# Patient Record
Sex: Male | Born: 1965 | Race: White | Hispanic: No | Marital: Married | Smoking: Never smoker
Health system: Southern US, Community
[De-identification: ages and names within clinical notes are randomized; demographics above are authoritative.]

## PROBLEM LIST (undated history)

## (undated) HISTORY — PX: TONSILLECTOMY: SUR1361

## (undated) HISTORY — PX: APPENDECTOMY: SHX54

---

## 2021-05-23 ENCOUNTER — Emergency Department (HOSPITAL_BASED_OUTPATIENT_CLINIC_OR_DEPARTMENT_OTHER): Payer: BLUE CROSS/BLUE SHIELD

## 2021-05-23 ENCOUNTER — Emergency Department (HOSPITAL_BASED_OUTPATIENT_CLINIC_OR_DEPARTMENT_OTHER)
Admission: EM | Admit: 2021-05-23 | Discharge: 2021-05-23 | Disposition: A | Discharge status: Home / Self Care | Payer: BLUE CROSS/BLUE SHIELD | Attending: Emergency Medicine | Admitting: Emergency Medicine

## 2021-05-23 ENCOUNTER — Encounter (HOSPITAL_BASED_OUTPATIENT_CLINIC_OR_DEPARTMENT_OTHER): Payer: Self-pay | Admitting: *Deleted

## 2021-05-23 ENCOUNTER — Other Ambulatory Visit: Payer: Self-pay

## 2021-05-23 DIAGNOSIS — R079 Chest pain, unspecified: Secondary | ICD-10-CM

## 2021-05-23 DIAGNOSIS — R0789 Other chest pain: Secondary | ICD-10-CM | POA: Insufficient documentation

## 2021-05-23 DIAGNOSIS — R000 Tachycardia, unspecified: Secondary | ICD-10-CM | POA: Diagnosis not present

## 2021-05-23 LAB — CBC
HCT: 52.9 % — ABNORMAL HIGH (ref 39.0–52.0)
Hemoglobin: 18.4 g/dL — ABNORMAL HIGH (ref 13.0–17.0)
MCH: 30.8 pg (ref 26.0–34.0)
MCHC: 34.8 g/dL (ref 30.0–36.0)
MCV: 88.6 fL (ref 80.0–100.0)
Platelets: 223 10*3/uL (ref 150–400)
RBC: 5.97 MIL/uL — ABNORMAL HIGH (ref 4.22–5.81)
RDW: 13.2 % (ref 11.5–15.5)
WBC: 14.1 10*3/uL — ABNORMAL HIGH (ref 4.0–10.5)
nRBC: 0 % (ref 0.0–0.2)

## 2021-05-23 LAB — BASIC METABOLIC PANEL
Anion gap: 10 (ref 5–15)
BUN: 11 mg/dL (ref 6–20)
CO2: 24 mmol/L (ref 22–32)
Calcium: 9.3 mg/dL (ref 8.9–10.3)
Chloride: 102 mmol/L (ref 98–111)
Creatinine, Ser: 1.24 mg/dL (ref 0.61–1.24)
GFR, Estimated: >60 mL/min (ref >60)
Glucose, Bld: 126 mg/dL — ABNORMAL HIGH (ref 70–99)
Potassium: 3.8 mmol/L (ref 3.5–5.1)
Sodium: 136 mmol/L (ref 135–145)

## 2021-05-23 LAB — TROPONIN I (HIGH SENSITIVITY)
Troponin I (High Sensitivity): 3 ng/L (ref <18)
Troponin I (High Sensitivity): 3 ng/L (ref <18)

## 2021-05-23 LAB — D-DIMER, QUANTITATIVE: D-Dimer, Quant: <0.27 ug/mL-FEU (ref 0.00–0.50)

## 2021-05-23 MED ORDER — ONDANSETRON HCL 4 MG/2ML IJ SOLN
4.0000 mg | Freq: Once | INTRAMUSCULAR | Status: DC
  Filled 2021-05-23: qty 2

## 2021-05-23 MED ORDER — MORPHINE SULFATE (PF) 4 MG/ML IV SOLN
4.0000 mg | Freq: Once | INTRAVENOUS | Status: DC
  Filled 2021-05-23: qty 1
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
## 2022-08-26 IMAGING — DX DG CHEST 1V PORT
1 series · 1 of 1 positions shown · non-contrast
Comparison: None.

CLINICAL DATA: Chest pain

EXAM:
PORTABLE CHEST 1 VIEW

[chest ap]
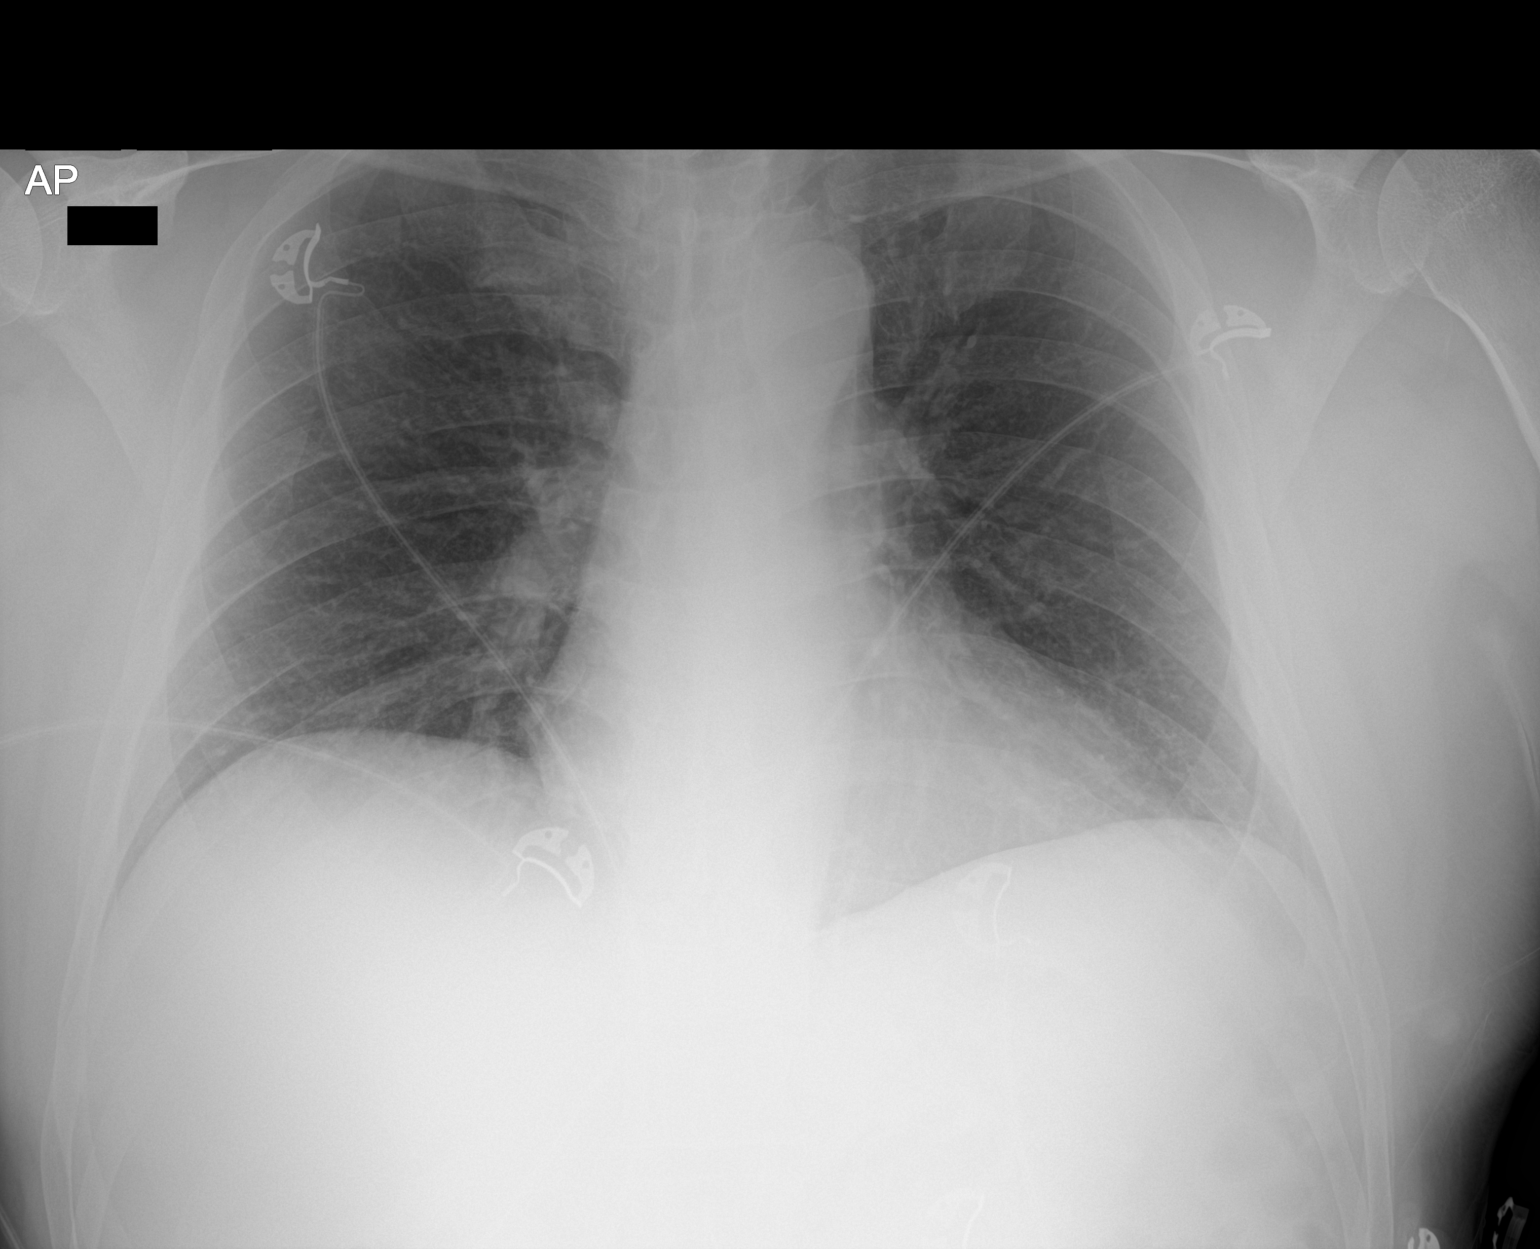

[1 of 1 positions shown; findings below may reference images not displayed]

FINDINGS: The heart size and mediastinal contours are within normal limits.
Both lungs are clear. The visualized skeletal structures are
unremarkable.
IMPRESSION: No active disease.
